# Patient Record
Sex: Male | Born: 1960 | Race: Black or African American | Hispanic: No | Marital: Single
Health system: Southern US, Community
[De-identification: ages and names within clinical notes are randomized; demographics above are authoritative.]

---

## 2000-01-28 ENCOUNTER — Encounter (INDEPENDENT_AMBULATORY_CARE_PROVIDER_SITE_OTHER): Payer: Self-pay | Admitting: Specialist

## 2000-01-28 ENCOUNTER — Ambulatory Visit (HOSPITAL_COMMUNITY): Admission: RE | Admit: 2000-01-28 | Discharge: 2000-01-28 | Payer: Self-pay | Admitting: Gastroenterology

## 2008-10-26 ENCOUNTER — Encounter: Admission: RE | Admit: 2008-10-26 | Discharge: 2008-10-26 | Payer: Self-pay | Admitting: Family Medicine

## 2010-05-28 ENCOUNTER — Emergency Department (HOSPITAL_COMMUNITY): Admission: EM | Admit: 2010-05-28 | Discharge: 2010-05-28 | Payer: Self-pay | Admitting: Emergency Medicine

## 2018-03-22 ENCOUNTER — Emergency Department (HOSPITAL_COMMUNITY): Payer: 59

## 2018-03-22 ENCOUNTER — Other Ambulatory Visit: Payer: Self-pay

## 2018-03-22 ENCOUNTER — Emergency Department (HOSPITAL_COMMUNITY)
Admission: EM | Admit: 2018-03-22 | Discharge: 2018-03-22 | Disposition: A | Discharge status: Home / Self Care | Payer: 59 | Attending: Emergency Medicine | Admitting: Emergency Medicine

## 2018-03-22 DIAGNOSIS — R519 Headache, unspecified: Secondary | ICD-10-CM

## 2018-03-22 DIAGNOSIS — S199XXA Unspecified injury of neck, initial encounter: Secondary | ICD-10-CM | POA: Diagnosis present

## 2018-03-22 DIAGNOSIS — R51 Headache: Secondary | ICD-10-CM | POA: Insufficient documentation

## 2018-03-22 DIAGNOSIS — Y92410 Unspecified street and highway as the place of occurrence of the external cause: Secondary | ICD-10-CM | POA: Diagnosis not present

## 2018-03-22 DIAGNOSIS — S161XXA Strain of muscle, fascia and tendon at neck level, initial encounter: Secondary | ICD-10-CM | POA: Insufficient documentation

## 2018-03-22 DIAGNOSIS — Y999 Unspecified external cause status: Secondary | ICD-10-CM | POA: Insufficient documentation

## 2018-03-22 DIAGNOSIS — Y9389 Activity, other specified: Secondary | ICD-10-CM | POA: Insufficient documentation

## 2018-03-22 MED ORDER — FENTANYL CITRATE (PF) 100 MCG/2ML IJ SOLN
50.0000 ug | Freq: Once | INTRAMUSCULAR | Status: AC
  Administered 2018-03-22: 50 ug via INTRAMUSCULAR
  Filled 2018-03-22: qty 2

## 2018-03-22 MED ORDER — METHOCARBAMOL 500 MG PO TABS: 500.0000 mg | ORAL_TABLET | Freq: Two times a day (BID) | ORAL | 0 refills | Status: AC

## 2018-03-22 NOTE — Discharge Instructions (Signed)
You may alternate taking Tylenol and Ibuprofen as needed for pain control. You may take 400 mg of ibuprofen every 6 hours and 500-1000 mg of Tylenol every 6 hours. Do not exceed 4000 mg of Tylenol daily as this can lead to liver damage. Also, make sure to take Ibuprofen with meals as it can cause an upset stomach. Do not take other NSAIDs while taking Ibuprofen such as (Aleve, Naprosyn, Aspirin, Celebrex, etc) and do not take more than the prescribed dose as this can lead to ulcers and bleeding in your GI tract. You may use warm and cold compresses to help with your symptoms.  ° °You were given a prescription for Robaxin which is a muscle relaxer.  You should not drive, work, or operate machinery while taking this medication as it can make you very drowsy.   ° °Please follow up with your primary doctor within the next 7-10 days for re-evaluation and further treatment of your symptoms.  ° °Please return to the ER sooner if you have any new or worsening symptoms. ° °
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
## 2018-06-30 ENCOUNTER — Emergency Department
Admission: EM | Admit: 2018-06-30 | Discharge: 2018-06-30 | Disposition: A | Discharge status: Home / Self Care | Payer: Commercial Managed Care - HMO | Attending: Emergency Medicine | Admitting: Emergency Medicine

## 2018-06-30 ENCOUNTER — Emergency Department: Payer: Commercial Managed Care - HMO

## 2018-06-30 ENCOUNTER — Other Ambulatory Visit: Payer: Self-pay | Admitting: Emergency Medicine

## 2018-06-30 ENCOUNTER — Ambulatory Visit
Admission: RE | Admit: 2018-06-30 | Discharge: 2018-06-30 | Disposition: A | Discharge status: Home / Self Care | Payer: Commercial Managed Care - HMO | Source: Ambulatory Visit | Attending: Emergency Medicine | Admitting: Emergency Medicine

## 2018-06-30 DIAGNOSIS — S060X0A Concussion without loss of consciousness, initial encounter: Secondary | ICD-10-CM | POA: Diagnosis not present

## 2018-06-30 DIAGNOSIS — Y999 Unspecified external cause status: Secondary | ICD-10-CM | POA: Diagnosis not present

## 2018-06-30 DIAGNOSIS — Y9389 Activity, other specified: Secondary | ICD-10-CM | POA: Insufficient documentation

## 2018-06-30 DIAGNOSIS — Y92411 Interstate highway as the place of occurrence of the external cause: Secondary | ICD-10-CM | POA: Insufficient documentation

## 2018-06-30 DIAGNOSIS — M542 Cervicalgia: Secondary | ICD-10-CM | POA: Diagnosis not present

## 2018-06-30 DIAGNOSIS — Z041 Encounter for examination and observation following transport accident: Secondary | ICD-10-CM | POA: Diagnosis present

## 2018-06-30 DIAGNOSIS — M25511 Pain in right shoulder: Secondary | ICD-10-CM | POA: Insufficient documentation

## 2018-06-30 DIAGNOSIS — R52 Pain, unspecified: Secondary | ICD-10-CM

## 2018-06-30 MED ORDER — ONDANSETRON 4 MG PO TBDP: 4.0000 mg | ORAL_TABLET | Freq: Three times a day (TID) | ORAL | 0 refills | Status: AC | PRN

## 2018-06-30 MED ORDER — IBUPROFEN 600 MG PO TABS: 600.0000 mg | ORAL_TABLET | Freq: Three times a day (TID) | ORAL | 0 refills | Status: AC | PRN

## 2018-06-30 MED ORDER — LIDOCAINE 5 % EX PTCH
1.0000 | MEDICATED_PATCH | Freq: Once | CUTANEOUS | Status: DC
  Filled 2018-06-30: qty 1

## 2018-06-30 NOTE — ED Notes (Signed)
Lidocaine patch placed during downtime
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
## 2020-12-03 ENCOUNTER — Other Ambulatory Visit: Payer: Self-pay | Admitting: General Practice

## 2020-12-03 DIAGNOSIS — M26639 Articular disc disorder of temporomandibular joint, unspecified side: Secondary | ICD-10-CM

## 2020-12-28 ENCOUNTER — Other Ambulatory Visit: Payer: Self-pay

## 2020-12-28 ENCOUNTER — Ambulatory Visit
Admission: RE | Admit: 2020-12-28 | Discharge: 2020-12-28 | Disposition: A | Discharge status: Home / Self Care | Payer: 59 | Source: Ambulatory Visit | Attending: General Practice | Admitting: General Practice

## 2020-12-28 DIAGNOSIS — M26639 Articular disc disorder of temporomandibular joint, unspecified side: Secondary | ICD-10-CM

## 2022-11-08 IMAGING — MR MR [PERSON_NAME]
11 series · 16 of 16 positions shown · non-contrast
Comparison: None.

CLINICAL DATA: Chronic TMJ pain, left worse than right.

EXAM:
MRI OF TEMPOROMANDIBULAR JOINT WITHOUT CONTRAST
TECHNIQUE: Multiplanar, multisequence MR imaging of the temporomandibular joint
was performed following the standard protocol. No intravenous
contrast was administered.

[Series 6: T1 · axial · 4.0mm · 0.59mm/px · z∈[-43,+17]mm · 2 of 16 slices shown]
[im 1/16]
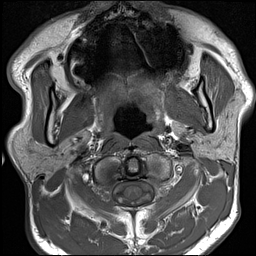
[im 16/16]
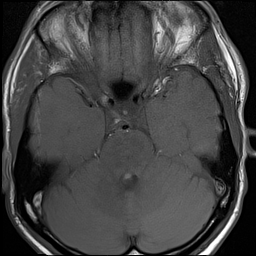

[Series 7: T2 fat-sat · sagittal · 4.0mm · 0.62mm/px · 1 of 11 slices shown (1 of 2)]
[im 1/11]
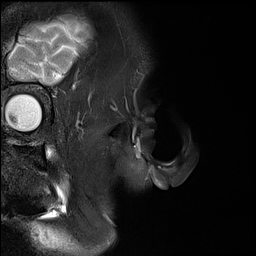

[Series 8: PD · coronal · 4.0mm · 0.44mm/px · 2 of 13 slices shown (1 of 8)]
[im 1/13]
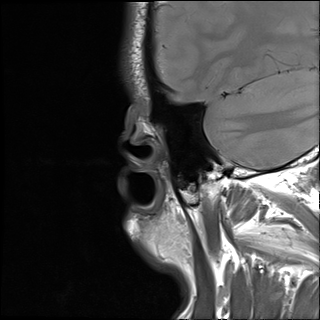
[im 13/13]
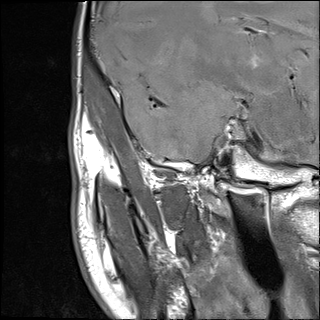

[Series 9: PD · coronal · 4.0mm · 0.44mm/px · 2 of 13 slices shown (2 of 8)]
[im 1/13]
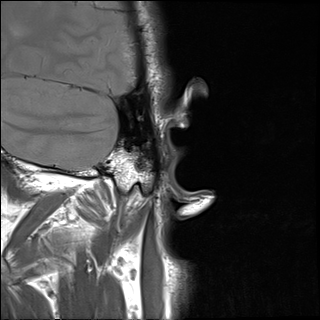
[im 13/13]
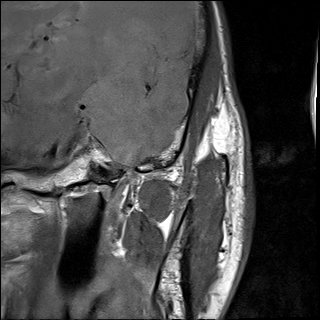

[Series 10: T2 fat-sat · sagittal · 4.0mm · 0.62mm/px · 1 of 11 slices shown (2 of 2)]
[im 1/11]
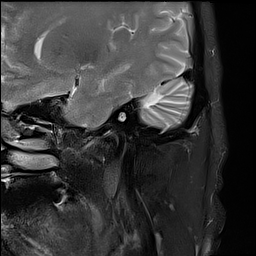

[Series 11: PD · sagittal · 4.0mm · 0.44mm/px · 1 of 11 slices shown (3 of 8)]
[im 1/11]
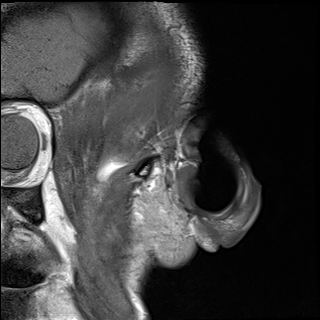

[Series 12: PD · sagittal · 4.0mm · 0.44mm/px · 1 of 9 slices shown (4 of 8)]
[im 1/9]
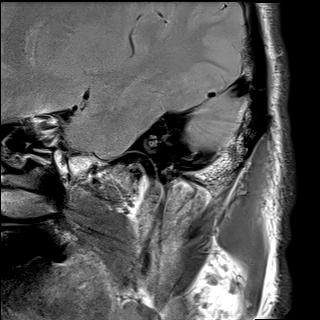

[Series 14: PD · sagittal · 4.0mm · 0.44mm/px · 1 of 11 slices shown (5 of 8)]
[im 1/11]
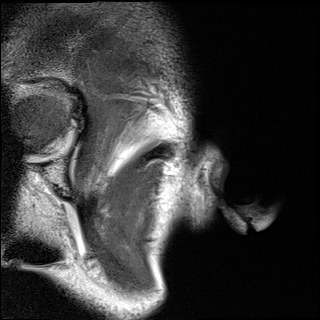

[Series 15: PD · sagittal · 4.0mm · 0.44mm/px · 1 of 11 slices shown (6 of 8)]
[im 1/11]
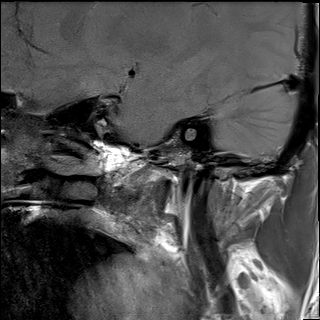

[Series 16: PD · coronal · 4.0mm · 0.44mm/px · 2 of 13 slices shown (7 of 8)]
[im 1/13]
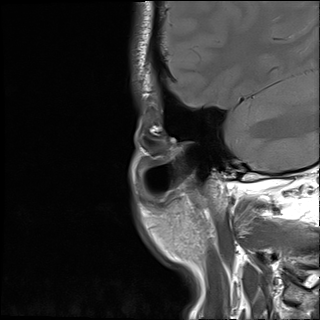
[im 13/13]
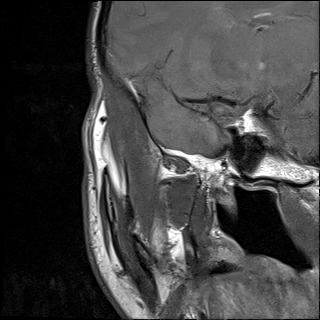

[Series 17: PD · oblique · 4.0mm · 0.44mm/px · 2 of 13 slices shown (8 of 8)]
[im 1/13]
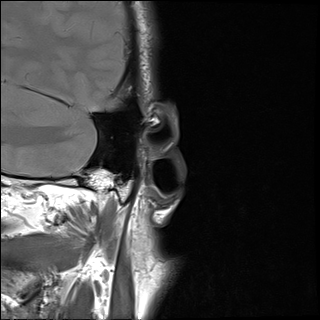
[im 13/13]
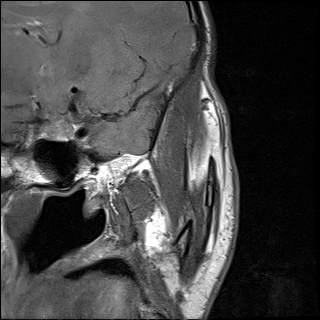

[16 of 16 positions shown; findings below may reference images not displayed]

FINDINGS: Right temporomandibular joint: The articular disc is normally
positioned between the mandibular condyle and the temporal bone in
both open and closed positions.There is normal anterior translation
of the mandibular condyle with jaw opening.There is no joint
effusion. There is mild flattening of the articular surface of the
condylar head of the mandible.

Left temporomandibular joint: The articular disc is normally
positioned between the mandibular condyle and the temporal bone in
both open and closed positions. The anterior aspect of the disc is
degenerated. There is normal anterior translation of the mandibular
condyle with jaw opening.There is no joint effusion. There is marked
flattening of the articular surface of the condylar head. Mild edema
is seen in the condylar head.

Other: None.
IMPRESSION: Left much worse than right TMJ degenerative disease with flattening
of the condylar heads. Mild edema is seen in the left condylar head.
The discs appear intact but there is marked degeneration of the
anterior limb of the left articular disc.
